# Patient Record
Sex: Male | Born: 1990 | Hispanic: No | Marital: Married | Smoking: Never smoker
Health system: Southern US, Community
[De-identification: ages and names within clinical notes are randomized; demographics above are authoritative.]

---

## 2018-04-06 ENCOUNTER — Other Ambulatory Visit: Payer: Self-pay

## 2018-04-06 ENCOUNTER — Emergency Department
Admission: EM | Admit: 2018-04-06 | Discharge: 2018-04-06 | Disposition: A | Discharge status: Home / Self Care | Payer: Self-pay | Attending: Student in an Organized Health Care Education/Training Program | Admitting: Student in an Organized Health Care Education/Training Program

## 2018-04-06 ENCOUNTER — Emergency Department: Payer: Self-pay

## 2018-04-06 ENCOUNTER — Encounter: Payer: Self-pay | Admitting: Emergency Medicine

## 2018-04-06 DIAGNOSIS — R51 Headache: Secondary | ICD-10-CM | POA: Insufficient documentation

## 2018-04-06 DIAGNOSIS — R519 Headache, unspecified: Secondary | ICD-10-CM

## 2018-04-06 DIAGNOSIS — J0120 Acute ethmoidal sinusitis, unspecified: Secondary | ICD-10-CM | POA: Insufficient documentation

## 2018-04-06 LAB — GLUCOSE, CAPILLARY: GLUCOSE-CAPILLARY: 101 mg/dL — AB (ref 70–99)

## 2018-04-06 MED ORDER — ACETAMINOPHEN 500 MG PO TABS
1000.0000 mg | ORAL_TABLET | Freq: Once | ORAL | Status: AC
  Administered 2018-04-06: 1000 mg via ORAL
  Filled 2018-04-06: qty 2

## 2018-04-06 MED ORDER — FLUTICASONE PROPIONATE 50 MCG/ACT NA SUSP: 2.0000 | Freq: Every day | NASAL | 2 refills | Status: AC

## 2018-04-06 MED ORDER — DIPHENHYDRAMINE HCL 25 MG PO CAPS
25.0000 mg | ORAL_CAPSULE | Freq: Once | ORAL | Status: AC
  Administered 2018-04-06: 25 mg via ORAL
  Filled 2018-04-06: qty 1

## 2018-04-06 MED ORDER — PROCHLORPERAZINE MALEATE 10 MG PO TABS
10.0000 mg | ORAL_TABLET | Freq: Once | ORAL | Status: AC
  Administered 2018-04-06: 10 mg via ORAL
  Filled 2018-04-06: qty 1

## 2018-04-06 MED ORDER — DOXYCYCLINE HYCLATE 100 MG PO TABS: 100.0000 mg | ORAL_TABLET | Freq: Two times a day (BID) | ORAL | 0 refills | Status: AC

## 2018-04-06 NOTE — ED Provider Notes (Signed)
[facility] Emergency Department Provider Note    First MD Initiated Contact with Patient 04/06/18 1500     (approximate)  I have reviewed the triage vital signs and the nursing notes.   HISTORY  Chief Complaint Headache    HPI David Wiggins is a 27 y.o. male presents the ER for evaluation of over 2 weeks of intermittent headaches that are frontal in location.  States that it gets worse whenever he eats sweet foods.  Is never had headaches like this before.  They have been gradual in onset.  No sudden or thunderclap headache.  Denies any previous history of headaches.  Denies any fevers.   States that he does have photophobia with the headaches.  He does get some relief with taking ibuprofen.  States whenever he gets a headache he goes and lays in bed and sleeps for a bit which improves the headache.   History reviewed. No pertinent past medical history. History reviewed. No pertinent family history. History reviewed. No pertinent surgical history. There are no active problems to display for this patient.     Prior to Admission medications   Medication Sig Start Date End Date Taking? Authorizing Provider  doxycycline (VIBRA-TABS) 100 MG tablet Take 1 tablet (100 mg total) by mouth 2 (two) times daily for 7 days. 04/06/18 04/13/18  Willy Eddy, MD  fluticasone (FLONASE) 50 MCG/ACT nasal spray Place 2 sprays into both nostrils daily. 04/06/18 04/06/19  Willy Eddy, MD    Allergies Patient has no known allergies.    Social History Social History   Tobacco Use  . Smoking status: Never Smoker  . Smokeless tobacco: Never Used  Substance Use Topics  . Alcohol use: Never    Frequency: Never  . Drug use: Never    Review of Systems Patient denies headaches, rhinorrhea, blurry vision, numbness, shortness of breath, chest pain, edema, cough, abdominal pain, nausea, vomiting, diarrhea, dysuria, fevers, rashes or hallucinations unless otherwise  stated above in HPI. ____________________________________________   PHYSICAL EXAM:  VITAL SIGNS: Vitals:   04/06/18 1403 04/06/18 1610  BP: 134/85 118/74  Pulse: 88 77  Resp: 16 18  Temp: 98.5 F (36.9 C)   SpO2: 99% 97%    Constitutional: Alert and oriented.  Eyes: Conjunctivae are normal.  Head: Atraumatic. Nose: No congestion/rhinnorhea. Mouth/Throat: Mucous membranes are moist.   Neck: No stridor. Painless ROM.  Cardiovascular: Normal rate, regular rhythm. Grossly normal heart sounds.  Good peripheral circulation. Respiratory: Normal respiratory effort.  No retractions. Lungs CTAB. Gastrointestinal: Soft and nontender. No distention. No abdominal bruits. No CVA tenderness. Genitourinary:  Musculoskeletal: No lower extremity tenderness nor edema.  No joint effusions. Neurologic:  CN- intact.  No facial droop, Normal FNF.  Normal heel to shin.  Sensation intact bilaterally. Normal speech and language. No gross focal neurologic deficits are appreciated. No gait instability. Skin:  Skin is warm, dry and intact. No rash noted. Psychiatric: Mood and affect are normal. Speech and behavior are normal.  ____________________________________________   LABS (all labs ordered are listed, but only abnormal results are displayed)  Results for orders placed or performed during the hospital encounter of 04/06/18 (from the past 24 hour(s))  Glucose, capillary     Status: Abnormal   Collection Time: 04/06/18  2:08 PM  Result Value Ref Range   Glucose-Capillary 101 (H) 70 - 99 mg/dL   ____________________________________________  ____________________________________________  RADIOLOGY  I personally reviewed all radiographic images ordered to evaluate for the above acute complaints  and reviewed radiology reports and findings.  These findings were personally discussed with the patient.  Please see medical record for radiology  report.  ____________________________________________   PROCEDURES  Procedure(s) performed:  Procedures    Critical Care performed: no ____________________________________________   INITIAL IMPRESSION / ASSESSMENT AND PLAN / ED COURSE  Pertinent labs & imaging results that were available during my care of the patient were reviewed by me and considered in my medical decision making (see chart for details).   DDX: tension, cluster, migraine, mass, doubt infection or sah  David Wiggins is a 27 y.o. who presents to the ED with headache as described above.  Patient nontoxic-appearing.  Hemodynamically stable.  Neuro exam is nonfocal.  Does report photophobia.  CT imaging ordered to exclude more insidious process does show evidence of ethmoid sinusitis which certainly could explain the patient's headaches.  Will start on antibiotics.  Is not clinically consistent with subarachnoid meningitis or encephalitis.  Patient was able to tolerate PO and was able to ambulate with a steady gait.  Have discussed with the patient and available family all diagnostics and treatments performed thus far and all questions were answered to the best of my ability. The patient demonstrates understanding and agreement with plan.       As part of my medical decision making, I reviewed the following data within the electronic MEDICAL RECORD NUMBER Nursing notes reviewed and incorporated, Labs reviewed, notes from prior ED visits.  ____________________________________________   FINAL CLINICAL IMPRESSION(S) / ED DIAGNOSES  Final diagnoses:  Bad headache  Acute ethmoidal sinusitis, recurrence not specified      NEW MEDICATIONS STARTED DURING THIS VISIT:  New Prescriptions   DOXYCYCLINE (VIBRA-TABS) 100 MG TABLET    Take 1 tablet (100 mg total) by mouth 2 (two) times daily for 7 days.   FLUTICASONE (FLONASE) 50 MCG/ACT NASAL SPRAY    Place 2 sprays into both nostrils daily.     Note:  This document was  prepared using Dragon voice recognition software and may include unintentional dictation errors.    Willy Eddyobinson, Bracen Schum, MD 04/06/18 [phone]

## 2018-04-06 NOTE — ED Triage Notes (Signed)
Intermittent headaches over last week or two.  No history of headaches and does not normally get headaches. Gets better when takes meds but then comes back.  +photophobia when headache on.  Put pillow on head last night, took medicine and pain got better and he was able to watch TV again. Pt thinks the pain always comes after eating sugar. Reports eating ice cream before headache started last night.

## 2018-04-06 NOTE — ED Notes (Signed)
Pt taken to CT via wheelchair.

## 2018-04-06 NOTE — ED Notes (Signed)
Pt states daily headache for last week.  States relieved with ibuprofen.  Denies associated symptoms.

## 2018-04-06 NOTE — Discharge Instructions (Addendum)

## 2021-07-22 ENCOUNTER — Encounter: Payer: Self-pay | Admitting: Physician Assistant

## 2021-07-22 ENCOUNTER — Emergency Department
Admission: EM | Admit: 2021-07-22 | Discharge: 2021-07-22 | Disposition: A | Discharge status: Home / Self Care | Payer: Self-pay | Attending: Emergency Medicine | Admitting: Emergency Medicine

## 2021-07-22 ENCOUNTER — Other Ambulatory Visit: Payer: Self-pay

## 2021-07-22 ENCOUNTER — Emergency Department: Payer: Self-pay

## 2021-07-22 DIAGNOSIS — B49 Unspecified mycosis: Secondary | ICD-10-CM | POA: Insufficient documentation

## 2021-07-22 DIAGNOSIS — J3089 Other allergic rhinitis: Secondary | ICD-10-CM | POA: Insufficient documentation

## 2021-07-22 MED ORDER — METHYLPREDNISOLONE 4 MG PO TBPK: ORAL_TABLET | ORAL | 0 refills | Status: AC

## 2021-07-22 MED ORDER — ITRACONAZOLE 100 MG PO CAPS: 100.0000 mg | ORAL_CAPSULE | Freq: Two times a day (BID) | ORAL | 0 refills | Status: AC

## 2021-07-22 NOTE — ED Provider Notes (Signed)
? ?[facility] ?Provider Note ? ? ? Event Date/Time  ? First MD Initiated Contact with Patient 07/22/21 1015   ?  (approximate) ? ? ?History  ? ?Headache and Nasal Congestion ? ? ?HPI ? ?David Wiggins is a 31 y.o. male with history of sinusitis for 1 month presents emergency department complaining of worsening headache.  States pain was so bad yesterday that he almost passed out.  Patient has been on amoxicillin for about 20 days without any relief.  Was seen at [facility] this week in the told him it was a sinus infection.  Patient does have nasal polyps.  No fever or chills.  No chest pain shortness of breath.  No numbness or tingling.  No weakness. ? ?  ? ? ?Physical Exam  ? ?Triage Vital Signs: ?ED Triage Vitals  ?Enc Vitals Group  ?   BP 07/22/21 0955 123/90  ?   Pulse Rate 07/22/21 0955 82  ?   Resp 07/22/21 0955 14  ?   Temp 07/22/21 0955 98.6 ?F (37 ?C)  ?   Temp Source 07/22/21 0955 Oral  ?   SpO2 07/22/21 0955 97 %  ?   Weight 07/22/21 0955 200 lb (90.7 kg)  ?   Height 07/22/21 0955 5\' 6"  (1.676 m)  ?   Head Circumference --   ?   Peak Flow --   ?   Pain Score 07/22/21 0959 10  ?   Pain Loc --   ?   Pain Edu? --   ?   Excl. in GC? --   ? ? ?Most recent vital signs: ?Vitals:  ? 07/22/21 1020 07/22/21 1040  ?BP:  112/75  ?Pulse: 79 89  ?Resp:    ?Temp:    ?SpO2: 99% 99%  ? ? ? ?General: Awake, no distress.   ?CV:  Good peripheral perfusion. regular rate and  rhythm ?Resp:  Normal effort. Lungs CTA ?Abd:  No distention.   ?Other:  Nasal mucosa is swollen with green mucus noted in internally, neck is supple, nontender, frontal sinus and maxillary sinuses are nontender to palpation. ? ? ?ED Results / Procedures / Treatments  ? ?Labs ?(all labs ordered are listed, but only abnormal results are displayed) ?Labs Reviewed - No data to display ? ? ?EKG ? ? ? ? ?RADIOLOGY ?CT of the head ? ? ? ?PROCEDURES: ? ? ?Procedures ? ? ?MEDICATIONS ORDERED IN ED: ?Medications - No data to  display ? ? ?IMPRESSION / MDM / ASSESSMENT AND PLAN / ED COURSE  ?I reviewed the triage vital signs and the nursing notes. ?             ?               ? ?Differential diagnosis includes, but is not limited to, acute sinusitis, chronic sinusitis, migraine, subdural, SAH ? ?CT of the head ordered to rule out subdural SAH, will also determine sinusitis ? ?CT of the head was independently reviewed by me.  Awaiting radiology read.  Radiologist read as no intracranial abnormality.  Sinusitis is chronic and has advanced from previous CTs.  Also states allergic fungal sinusitis. ? ?Due to the sinusitis being allergic fungal, instructed patient to stop the antibiotics.  He is to take itraconazole and Medrol Dosepak.  Follow-up with [facility].  Return emergency department if worsening.  Up-to-date did say that itraconazole and glucose steroids for the first-line of therapy.  Patient is to return emergency department worsening.  He was in agreement treatment plan.  Discharged stable condition. ? ? ? ? ?  ? ? ?FINAL CLINICAL IMPRESSION(S) / ED DIAGNOSES  ? ?Final diagnoses:  ?Allergic fungal sinusitis  ? ? ? ?Rx / DC Orders  ? ?ED Discharge Orders   ? ?      Ordered  ?  itraconazole (SPORANOX) 100 MG capsule  2 times daily       ? 07/22/21 1132  ?  methylPREDNISolone (MEDROL DOSEPAK) 4 MG TBPK tablet       ? 07/22/21 1132  ? ?  ?  ? ?  ? ? ? ?Note:  This document was prepared using Dragon voice recognition software and may include unintentional dictation errors. ? ?  ?Faythe Ghee, PA-C ?07/22/21 1138 ? ?  ?Jene Every, MD ?07/22/21 1140 ? ?

## 2021-07-22 NOTE — Discharge Instructions (Signed)
You have a fungal sinus infection.  Stop the amoxicillin or any other antibiotic at this time, take the antifungal and steroid pack as prescribed ?Follow up with  ENT, tell them you were seen in the ER and need a follow up appointment for fungal sinusitis ?

## 2021-07-22 NOTE — ED Triage Notes (Signed)
Pt via POV from home. Pt states he has had a sinus infection for the past 30 days. Pt c/o headaches intermittently for the 20 days. Denies fever. States he has been on amoxicillin for the past.Pt is A&Ox4 and NAD.  ?

## 2021-07-22 NOTE — ED Notes (Signed)
Pt reports has been on "amoxicillin for 21 days for sinus infection". Reports history of nasal polyps. Pain intermittent per pt; 10/10 this morning; currently 7/10; pt reports pain is the worst early in morning and late at night. Pt reports blurred vision when HA is the worst; especially if it is bright per pt. Pt in NAD.  ?

## 2022-10-01 IMAGING — CT CT HEAD W/O CM
4 series · 17 of 47 positions shown, 19 images · non-contrast
Comparison: Head CT 04/06/2018.

CLINICAL DATA: 30-year-old male with headache.



[Series 2: head wo · axial · 0.45mm/px · z∈[-78,+62]mm · 7 of 38 slices shown, 9 images]
[im 5/38  brain]
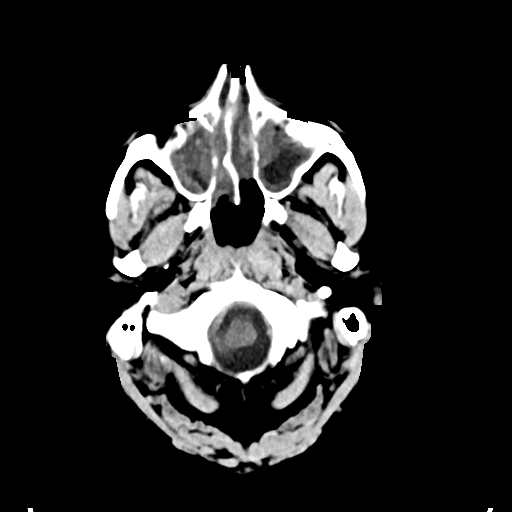
[im 5/38  bone]
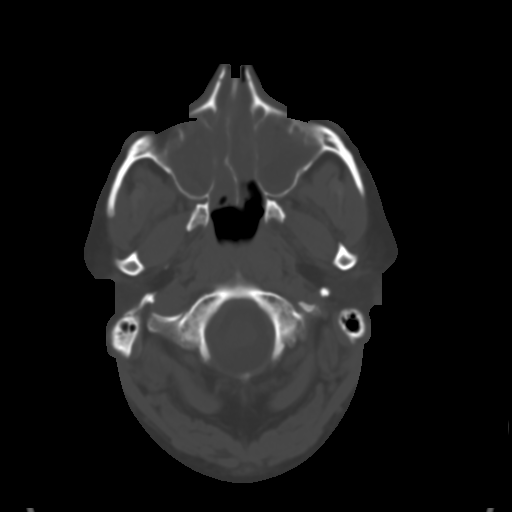
[im 10/38  brain]
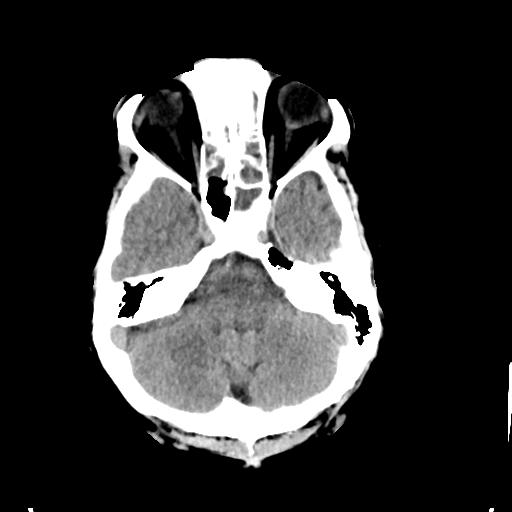
[im 14/38  brain]
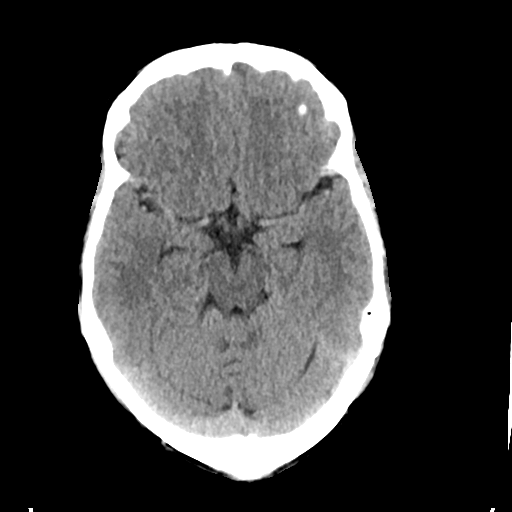
[im 19/38  brain]
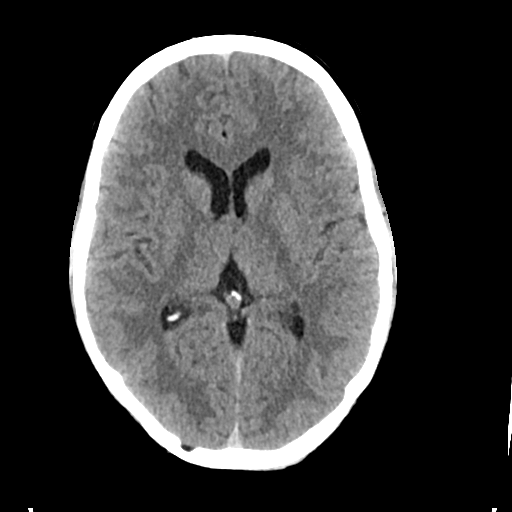
[im 24/38  brain]
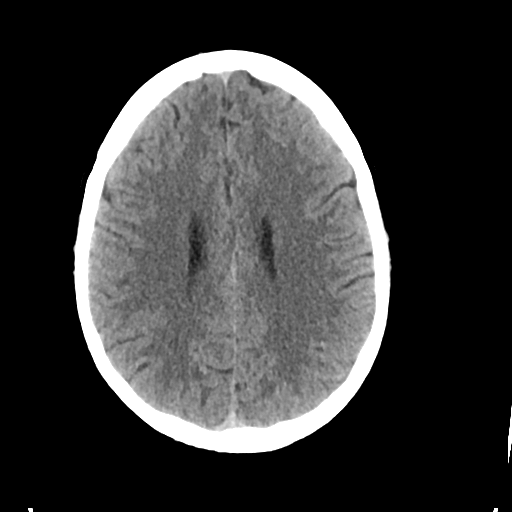
[im 24/38  bone]
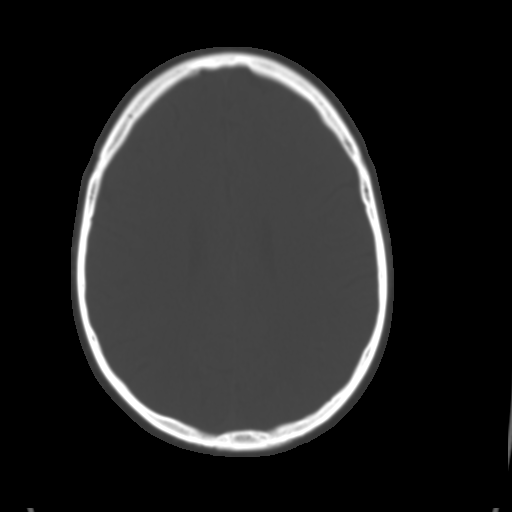
[im 28/38  brain]
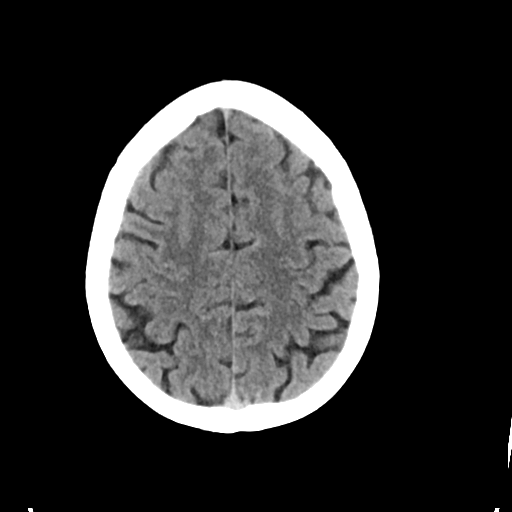
[im 33/38  brain]
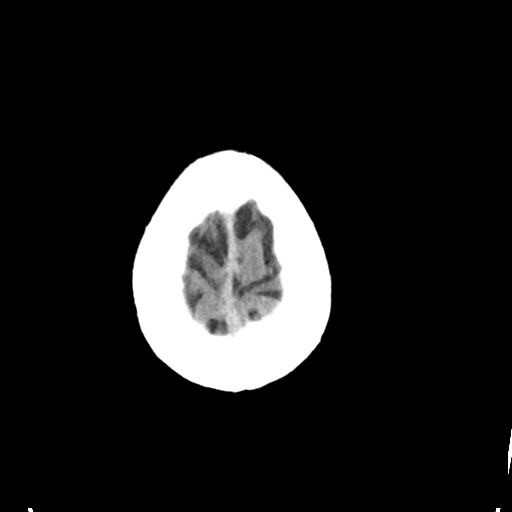

[Series 3: head bone · axial · 0.45mm/px · z∈[-80,-16]mm · 4 of 93 slices shown]
[im 10/93  bone]
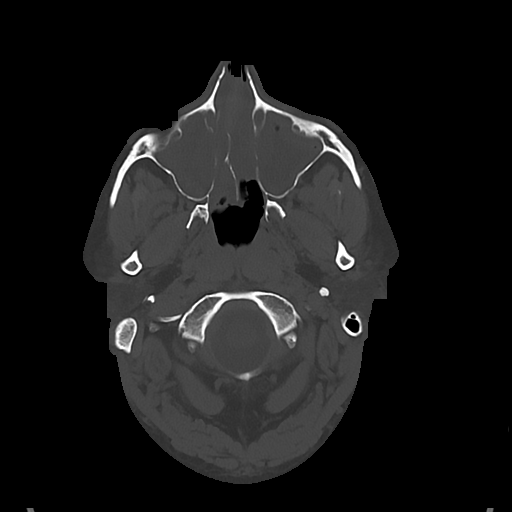
[im 19/93  bone]
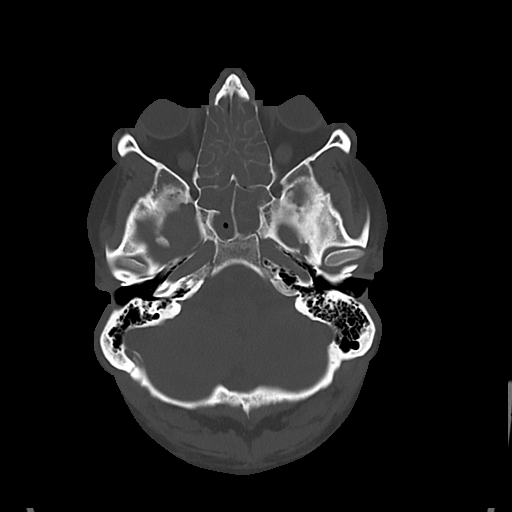
[im 28/93  bone]
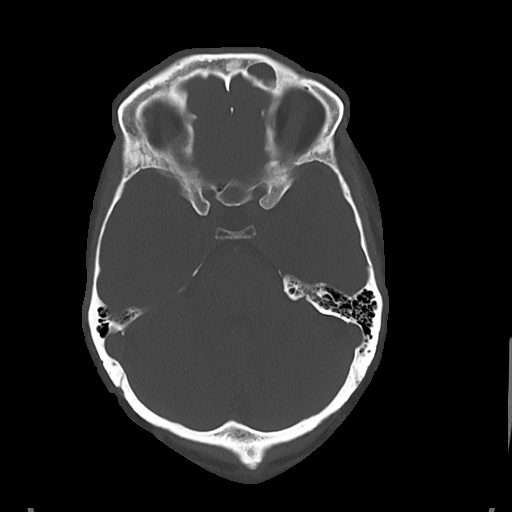
[im 42/93  bone]
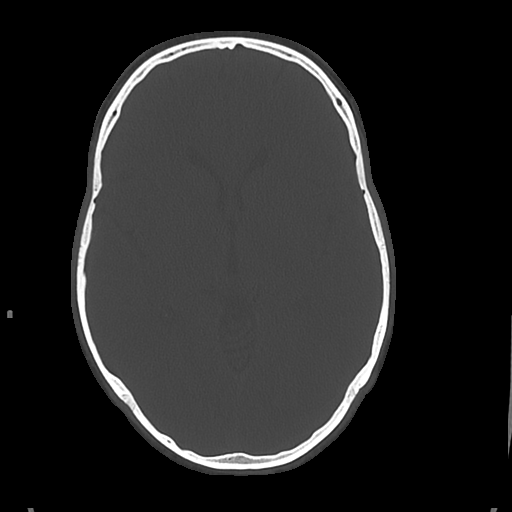

[Series 4: cor soft · coronal · 0.37mm/px · 3 of 72 slices shown]
[im 24/72  brain]
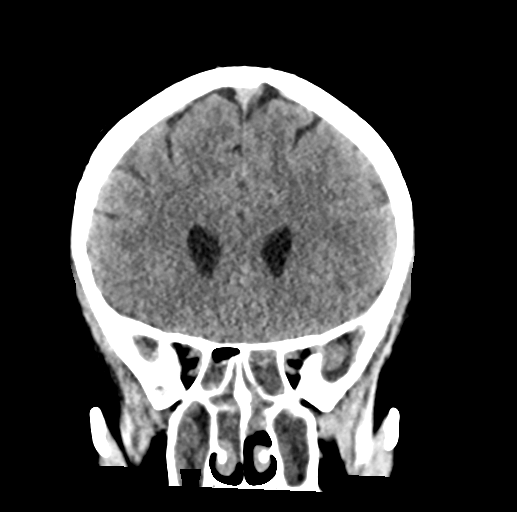
[im 32/72  brain]
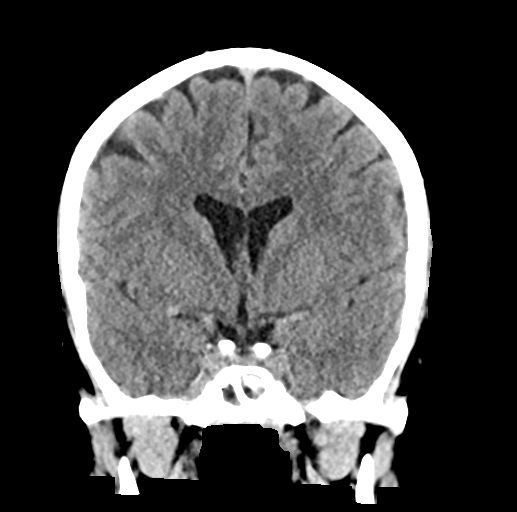
[im 40/72  brain]
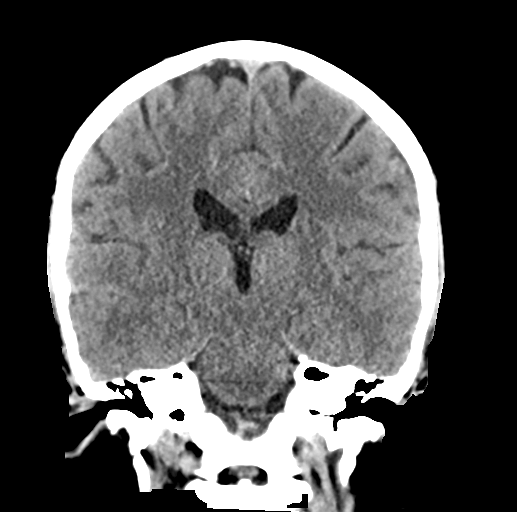

[Series 5: sag soft · sagittal · 0.44mm/px · 3 of 57 slices shown]
[im 19/57  brain]
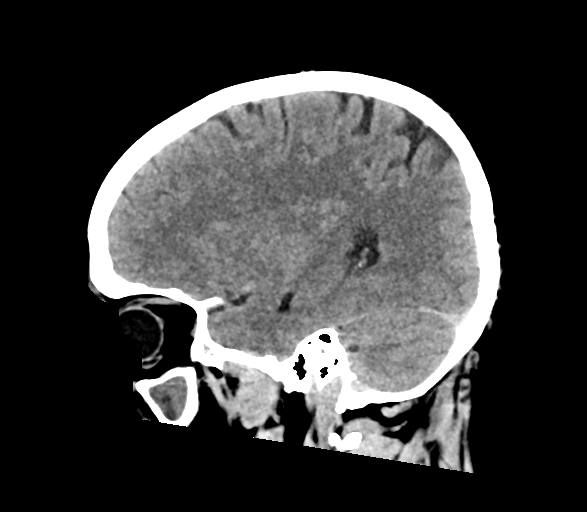
[im 29/57  brain]
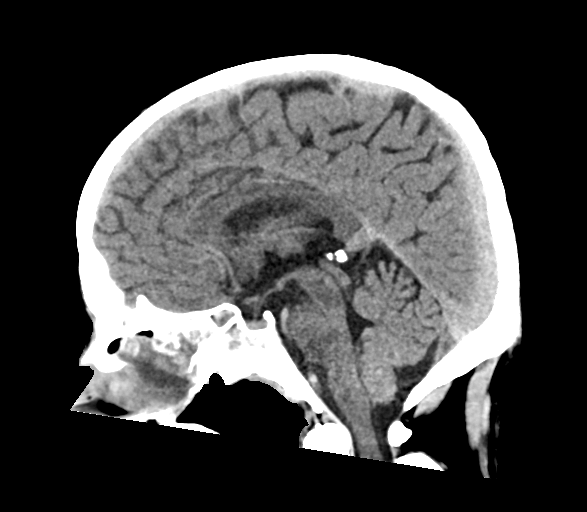
[im 38/57  brain]
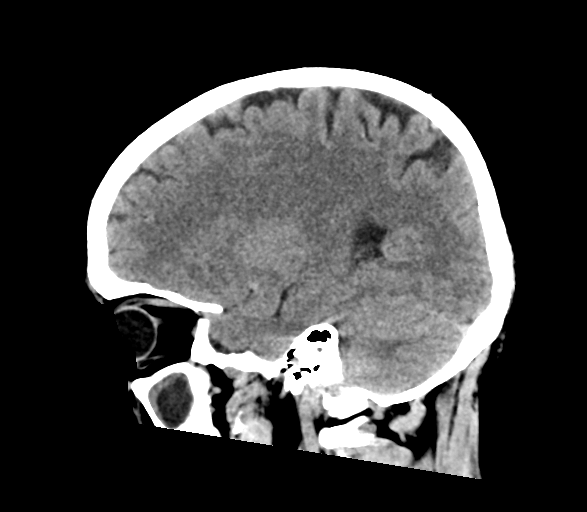

[17 of 47 positions shown; findings below may reference images not displayed]

FINDINGS: Brain: Stable cerebral volume. No midline shift, ventriculomegaly,
mass effect, evidence of mass lesion, intracranial hemorrhage or
evidence of cortically based acute infarction. Gray-white matter
differentiation is within normal limits throughout the brain.

Vascular: No suspicious intracranial vascular hyperdensity.

Skull: Negative.

Sinuses/Orbits: Chronic but increased and now subtotal bilateral
paranasal sinus opacification with heterogeneous material, some
hyperdense. Bubbly opacity in the right sphenoid sinus. Tympanic
cavities and mastoids remain clear. Up to mild associated periosteal
thickening. No complicating features identified.

Other: Visualized orbits and scalp soft tissues are within normal
limits.
IMPRESSION: 1. Stable and normal noncontrast CT appearance of the brain.
2. Progressed Pansinusitis since 1992. Consider sinonasal polyposis,
allergic fungal sinusitis. No complicating features identified.
